# Patient Record
Sex: Female | Born: 1995 | Race: White | Hispanic: No | Marital: Single | Smoking: Never smoker
Health system: Southern US, Community
[De-identification: ages and names within clinical notes are randomized; demographics above are authoritative.]

## PROBLEM LIST (undated history)

## (undated) DIAGNOSIS — E282 Polycystic ovarian syndrome: Secondary | ICD-10-CM

## (undated) HISTORY — PX: TONSILLECTOMY: SUR1361

## (undated) HISTORY — DX: Polycystic ovarian syndrome: E28.2

---
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
## 2017-01-30 ENCOUNTER — Encounter (HOSPITAL_COMMUNITY): Payer: Self-pay | Admitting: Obstetrics and Gynecology

## 2017-01-30 ENCOUNTER — Emergency Department (HOSPITAL_COMMUNITY): Payer: BC Managed Care – PPO

## 2017-01-30 ENCOUNTER — Other Ambulatory Visit: Payer: Self-pay

## 2017-01-30 ENCOUNTER — Emergency Department (HOSPITAL_COMMUNITY)
Admission: EM | Admit: 2017-01-30 | Discharge: 2017-01-30 | Disposition: A | Discharge status: Home / Self Care | Payer: BC Managed Care – PPO | Attending: Emergency Medicine | Admitting: Emergency Medicine

## 2017-01-30 DIAGNOSIS — N390 Urinary tract infection, site not specified: Secondary | ICD-10-CM | POA: Diagnosis not present

## 2017-01-30 DIAGNOSIS — R102 Pelvic and perineal pain: Secondary | ICD-10-CM | POA: Diagnosis present

## 2017-01-30 DIAGNOSIS — N83201 Unspecified ovarian cyst, right side: Secondary | ICD-10-CM | POA: Insufficient documentation

## 2017-01-30 DIAGNOSIS — R109 Unspecified abdominal pain: Secondary | ICD-10-CM

## 2017-01-30 DIAGNOSIS — G90521 Complex regional pain syndrome I of right lower limb: Secondary | ICD-10-CM

## 2017-01-30 LAB — URINALYSIS, ROUTINE W REFLEX MICROSCOPIC
Bilirubin Urine: NEGATIVE
Glucose, UA: >=500 mg/dL — AB
Hgb urine dipstick: NEGATIVE
Ketones, ur: NEGATIVE mg/dL
Nitrite: NEGATIVE
Protein, ur: NEGATIVE mg/dL
Specific Gravity, Urine: 1.018 (ref 1.005–1.030)
pH: 5 (ref 5.0–8.0)

## 2017-01-30 LAB — COMPREHENSIVE METABOLIC PANEL
ALT: 47 U/L (ref 14–54)
AST: 61 U/L — AB (ref 15–41)
Albumin: 3.8 g/dL (ref 3.5–5.0)
Alkaline Phosphatase: 46 U/L (ref 38–126)
Anion gap: 10 (ref 5–15)
BUN: 8 mg/dL (ref 6–20)
CHLORIDE: 105 mmol/L (ref 101–111)
CO2: 22 mmol/L (ref 22–32)
Calcium: 9.1 mg/dL (ref 8.9–10.3)
Creatinine, Ser: 0.59 mg/dL (ref 0.44–1.00)
Glucose, Bld: 112 mg/dL — ABNORMAL HIGH (ref 65–99)
Potassium: 3.6 mmol/L (ref 3.5–5.1)
Sodium: 137 mmol/L (ref 135–145)
Total Bilirubin: 0.8 mg/dL (ref 0.3–1.2)
Total Protein: 7.8 g/dL (ref 6.5–8.1)

## 2017-01-30 LAB — CBC
HCT: 34.5 % — ABNORMAL LOW (ref 36.0–46.0)
Hemoglobin: 10.5 g/dL — ABNORMAL LOW (ref 12.0–15.0)
MCH: 22.7 pg — ABNORMAL LOW (ref 26.0–34.0)
MCHC: 30.4 g/dL (ref 30.0–36.0)
MCV: 74.7 fL — AB (ref 78.0–100.0)
PLATELETS: 443 10*3/uL — AB (ref 150–400)
RBC: 4.62 MIL/uL (ref 3.87–5.11)
RDW: 16.2 % — AB (ref 11.5–15.5)
WBC: 10.6 10*3/uL — AB (ref 4.0–10.5)

## 2017-01-30 LAB — WET PREP, GENITAL
Clue Cells Wet Prep HPF POC: NONE SEEN
Sperm: NONE SEEN
TRICH WET PREP: NONE SEEN
Yeast Wet Prep HPF POC: NONE SEEN

## 2017-01-30 LAB — POC URINE PREG, ED: Preg Test, Ur: NEGATIVE

## 2017-01-30 MED ORDER — DEXTROSE 5 % IV SOLN
1.0000 g | Freq: Once | INTRAVENOUS | Status: AC
  Administered 2017-01-30: 1 g via INTRAVENOUS
  Filled 2017-01-30: qty 10

## 2017-01-30 MED ORDER — CEPHALEXIN 500 MG PO CAPS: 500.0000 mg | ORAL_CAPSULE | Freq: Four times a day (QID) | ORAL | 0 refills | Status: DC

## 2017-01-30 MED ORDER — IOPAMIDOL (ISOVUE-300) INJECTION 61%
INTRAVENOUS | Status: AC
  Administered 2017-01-30: 100 mL via INTRAVENOUS
  Filled 2017-01-30: qty 100

## 2017-01-30 MED ORDER — HYDROMORPHONE HCL 1 MG/ML IJ SOLN
1.0000 mg | Freq: Once | INTRAMUSCULAR | Status: AC
  Administered 2017-01-30: 1 mg via INTRAMUSCULAR
  Filled 2017-01-30: qty 1

## 2017-01-30 MED ORDER — SODIUM CHLORIDE 0.9 % IJ SOLN
INTRAMUSCULAR | Status: AC
  Filled 2017-01-30: qty 50

## 2017-01-30 NOTE — ED Provider Notes (Signed)
[facility]-EMERGENCY DEPT Provider Note   CSN: [id number] Arrival date & time: 01/30/17  1213     History   Chief Complaint Chief Complaint  Patient presents with  . Pelvic Pain  . Abdominal Pain    HPI Nicole Lopez is a 21 y.o. female.  Patient c/o right pelvic/flank pain onset at 2 am today at rest. Pain constant, dull, moderate, non radiating. Similar to remote prior pain from ovarian cyst. Denies dysuria or hematuria. No vaginal discharge or bleeding. Not sexually active. lnmp 2-3 weeks ago. No hx kidney stone. No back pain. Denies vomiting. No diarrhea. No fever or chills.    The history is provided by the patient.  Pelvic Pain  Associated symptoms include abdominal pain. Pertinent negatives include no chest pain, no headaches and no shortness of breath.  Abdominal Pain   Pertinent negatives include fever and headaches.    No past medical history on file.  There are no active problems to display for this patient.     OB History    No data available       Home Medications    Prior to Admission medications   Medication Sig Start Date End Date Taking? Authorizing Provider  ibuprofen (ADVIL,MOTRIN) 200 MG tablet Take 400 mg by mouth every 6 (six) hours as needed for cramping.   Yes [provider]    Family History No family history on file.  Social History Social History   Tobacco Use  . Smoking status: Never Smoker  Substance Use Topics  . Alcohol use: No    Frequency: Never  . Drug use: No     Allergies   Penicillins   Review of Systems Review of Systems  Constitutional: Negative for fever.  HENT: Negative for sore throat.   Eyes: Negative for redness.  Respiratory: Negative for shortness of breath.   Cardiovascular: Negative for chest pain.  Gastrointestinal: Positive for abdominal pain.  Genitourinary: Positive for pelvic pain. Negative for flank pain, vaginal bleeding and vaginal discharge.    Musculoskeletal: Negative for back pain and neck pain.  Skin: Negative for rash.  Neurological: Negative for headaches.  Hematological: Does not bruise/bleed easily.  Psychiatric/Behavioral: Negative for confusion.     Physical Exam Updated Vital Signs BP (!) 141/96 (BP Location: Left Arm)   Pulse 84   Temp 98.2 F (36.8 C) (Oral)   Resp 18   Ht 1.803 m (5\' 11" )   LMP 01/16/2017   SpO2 98%   Physical Exam  Constitutional: She appears well-developed and well-nourished. No distress.  HENT:  Mouth/Throat: Oropharynx is clear and moist.  Eyes: Conjunctivae are normal. No scleral icterus.  Neck: Neck supple. No tracheal deviation present.  Cardiovascular: Normal rate, regular rhythm, normal heart sounds and intact distal pulses. Exam reveals no gallop and no friction rub.  No murmur heard. Pulmonary/Chest: Effort normal and breath sounds normal. No respiratory distress.  Abdominal: Soft. Normal appearance and bowel sounds are normal. She exhibits no distension and no mass. There is no tenderness. There is no rebound and no guarding. No hernia.  Genitourinary:  Genitourinary Comments: No cva tenderness. Pt w no prior sexual activity or tampon use - pt unable to tolerate internal exam, mild whitish discharge noted.   Musculoskeletal: She exhibits no edema.  Neurological: She is alert.  Skin: Skin is warm and dry. No rash noted. She is not diaphoretic.  Psychiatric: She has a normal mood and affect.  Nursing note and vitals reviewed.  ED Treatments / Results  Labs (all labs ordered are listed, but only abnormal results are displayed) Results for orders placed or performed during the hospital encounter of 01/30/17  Wet prep, genital  Result Value Ref Range   Yeast Wet Prep HPF POC NONE SEEN NONE SEEN   Trich, Wet Prep NONE SEEN NONE SEEN   Clue Cells Wet Prep HPF POC NONE SEEN NONE SEEN   WBC, Wet Prep HPF POC MANY (A) NONE SEEN   Sperm NONE SEEN   Urinalysis, Routine w  reflex microscopic  Result Value Ref Range   Color, Urine YELLOW YELLOW   APPearance HAZY (A) CLEAR   Specific Gravity, Urine 1.018 1.005 - 1.030   pH 5.0 5.0 - 8.0   Glucose, UA >=500 (A) NEGATIVE mg/dL   Hgb urine dipstick NEGATIVE NEGATIVE   Bilirubin Urine NEGATIVE NEGATIVE   Ketones, ur NEGATIVE NEGATIVE mg/dL   Protein, ur NEGATIVE NEGATIVE mg/dL   Nitrite NEGATIVE NEGATIVE   Leukocytes, UA LARGE (A) NEGATIVE   RBC / HPF 6-30 0 - 5 RBC/hpf   WBC, UA TOO NUMEROUS TO COUNT 0 - 5 WBC/hpf   Bacteria, UA FEW (A) NONE SEEN   Squamous Epithelial / LPF 6-30 (A) NONE SEEN   Mucus PRESENT   CBC  Result Value Ref Range   WBC 10.6 (H) 4.0 - 10.5 K/uL   RBC 4.62 3.87 - 5.11 MIL/uL   Hemoglobin 10.5 (L) 12.0 - 15.0 g/dL   HCT 96.0 (L) 45.4 - 09.8 %   MCV 74.7 (L) 78.0 - 100.0 fL   MCH 22.7 (L) 26.0 - 34.0 pg   MCHC 30.4 30.0 - 36.0 g/dL   RDW 11.9 (H) 14.7 - 82.9 %   Platelets 443 (H) 150 - 400 K/uL  Comprehensive metabolic panel  Result Value Ref Range   Sodium 137 135 - 145 mmol/L   Potassium 3.6 3.5 - 5.1 mmol/L   Chloride 105 101 - 111 mmol/L   CO2 22 22 - 32 mmol/L   Glucose, Bld 112 (H) 65 - 99 mg/dL   BUN 8 6 - 20 mg/dL   Creatinine, Ser 5.62 0.44 - 1.00 mg/dL   Calcium 9.1 8.9 - 13.0 mg/dL   Total Protein 7.8 6.5 - 8.1 g/dL   Albumin 3.8 3.5 - 5.0 g/dL   AST 61 (H) 15 - 41 U/L   ALT 47 14 - 54 U/L   Alkaline Phosphatase 46 38 - 126 U/L   Total Bilirubin 0.8 0.3 - 1.2 mg/dL   GFR calc non Af Amer >60 >60 mL/min   GFR calc Af Amer >60 >60 mL/min   Anion gap 10 5 - 15  POC urine preg, ED  Result Value Ref Range   Preg Test, Ur NEGATIVE NEGATIVE   US Transvaginal Non-ob  Result Date: 01/30/2017 CLINICAL DATA:  Right-sided pelvic/ flank pain. History of prior left salpingo-oophorectomy for ovarian torsion. EXAM: TRANSABDOMINAL AND TRANSVAGINAL ULTRASOUND OF PELVIS DOPPLER ULTRASOUND OF OVARIES TECHNIQUE: Both transabdominal and transvaginal ultrasound examinations of  the pelvis were performed. Transabdominal technique was performed for global imaging of the pelvis including uterus, ovaries, adnexal regions, and pelvic cul-de-sac. It was necessary to proceed with endovaginal exam following the transabdominal exam to visualize the ovaries and endometrium. Color and duplex Doppler ultrasound was utilized to evaluate blood flow to the ovaries. COMPARISON:  None. FINDINGS: Uterus Measurements: 8.2 x 2.5 x 4.6 cm. No fibroids or other mass visualized. Endometrium Thickness: 5.7 mm.  No focal  abnormality visualized. Right ovary Measurements: 3.9 x 2.6 x 3.8 cm. 2.4 x 2.9 x 2.4 cm cyst. Unable to document arterial blood flow. Color flow Doppler was demonstrated and venous blood flow was demonstrated. Difficult exam due to body habitus and being unable to perform transvaginal examination due to pain. Left ovary Surgically absent. Pulsed Doppler evaluation of both ovaries demonstrates unable to document arterial blood flow to the right ovary. Other findings No abnormal free fluid. IMPRESSION: 1. Status post left salpingo-oophorectomy. 2. Slightly enlarged right ovary. Unable to document arterial blood flow. This could be technical as transvaginal examination could not be performed. 3. Normal appearance of the uterus. Electronically Signed   By: Rudie MeyerP.  Gallerani M.D.   On: 01/30/2017 19:27   [place] Pelvis Complete  Result Date: 01/30/2017 CLINICAL DATA:  Right-sided pelvic/ flank pain. History of prior left salpingo-oophorectomy for ovarian torsion. EXAM: TRANSABDOMINAL AND TRANSVAGINAL ULTRASOUND OF PELVIS DOPPLER ULTRASOUND OF OVARIES TECHNIQUE: Both transabdominal and transvaginal ultrasound examinations of the pelvis were performed. Transabdominal technique was performed for global imaging of the pelvis including uterus, ovaries, adnexal regions, and pelvic cul-de-sac. It was necessary to proceed with endovaginal exam following the transabdominal exam to visualize the ovaries and  endometrium. Color and duplex Doppler ultrasound was utilized to evaluate blood flow to the ovaries. COMPARISON:  None. FINDINGS: Uterus Measurements: 8.2 x 2.5 x 4.6 cm. No fibroids or other mass visualized. Endometrium Thickness: 5.7 mm.  No focal abnormality visualized. Right ovary Measurements: 3.9 x 2.6 x 3.8 cm. 2.4 x 2.9 x 2.4 cm cyst. Unable to document arterial blood flow. Color flow Doppler was demonstrated and venous blood flow was demonstrated. Difficult exam due to body habitus and being unable to perform transvaginal examination due to pain. Left ovary Surgically absent. Pulsed Doppler evaluation of both ovaries demonstrates unable to document arterial blood flow to the right ovary. Other findings No abnormal free fluid. IMPRESSION: 1. Status post left salpingo-oophorectomy. 2. Slightly enlarged right ovary. Unable to document arterial blood flow. This could be technical as transvaginal examination could not be performed. 3. Normal appearance of the uterus. Electronically Signed   By: Rudie MeyerP.  Gallerani M.D.   On: 01/30/2017 19:27   Ct Abdomen Pelvis W Contrast  Result Date: 01/30/2017 CLINICAL DATA:  Pelvic pain. Prior history of left salpingo-oophorectomy for ovarian torsion. EXAM: CT ABDOMEN AND PELVIS WITH CONTRAST TECHNIQUE: Multidetector CT imaging of the abdomen and pelvis was performed using the standard protocol following bolus administration of intravenous contrast. CONTRAST:  100 cc ISOVUE-300 IOPAMIDOL (ISOVUE-300) INJECTION 61% COMPARISON:  Pelvic ultrasound 01/30/2017 FINDINGS: Lower chest: The lung bases are clear of acute process. No pleural effusion or pulmonary lesions. The heart is normal in size. No pericardial effusion. The distal esophagus and aorta are unremarkable. Hepatobiliary: Mild diffuse fatty infiltration of the liver but no focal hepatic lesions or intrahepatic biliary dilatation. The gallbladder is normal. No common bile duct dilatation. Pancreas: No mass, inflammation  or ductal dilatation. Spleen: Normal size.  No focal lesions. Adrenals/Urinary Tract: The adrenal glands and kidneys are normal. No bladder abnormality. Stomach/Bowel: The stomach, duodenum, small bowel and colon are grossly normal without oral contrast. No acute inflammatory changes, mass lesions or obstructive findings. High transverse cecum with the ileocecal bowel just the left of the midline of the upper pelvis. The terminal ileum is normal. The appendix is normal. Vascular/Lymphatic: The aorta is normal in caliber. No dissection. The branch vessels are patent. The major venous structures are patent. No mesenteric or retroperitoneal  mass or adenopathy. Small scattered lymph nodes are noted. Reproductive: The uterus is normal. The left ovary is surgically absent. The right ovary is slightly enlarged measuring 5.2 x 5.1 x 3.3 cm. I do not see any inflammation around the ovary to suggest ovarian torsion. Multiple follicles are noted. There is a small adjacent paraovarian cyst. Other: No free pelvic fluid collections. Scattered pelvic and axillary lymph nodes. Musculoskeletal: No significant bony findings. IMPRESSION: 1. The right ovary is slightly enlarged but no obvious inflammatory process or secondary findings to suggest ovarian torsion. Small adjacent simple appearing cysts is noted. 2. The left ovary is surgically absent. 3. High transverse cecum with the ileocecal valve in the left upper pelvis. The appendix is normal. 4. Mild diffuse fatty infiltration of liver. Electronically Signed   By: Rudie Meyer M.D.   On: 01/30/2017 19:54   [place] Art/ven Flow Abd Pelv Doppler  Result Date: 01/30/2017 CLINICAL DATA:  Right-sided pelvic/ flank pain. History of prior left salpingo-oophorectomy for ovarian torsion. EXAM: TRANSABDOMINAL AND TRANSVAGINAL ULTRASOUND OF PELVIS DOPPLER ULTRASOUND OF OVARIES TECHNIQUE: Both transabdominal and transvaginal ultrasound examinations of the pelvis were performed. Transabdominal  technique was performed for global imaging of the pelvis including uterus, ovaries, adnexal regions, and pelvic cul-de-sac. It was necessary to proceed with endovaginal exam following the transabdominal exam to visualize the ovaries and endometrium. Color and duplex Doppler ultrasound was utilized to evaluate blood flow to the ovaries. COMPARISON:  None. FINDINGS: Uterus Measurements: 8.2 x 2.5 x 4.6 cm. No fibroids or other mass visualized. Endometrium Thickness: 5.7 mm.  No focal abnormality visualized. Right ovary Measurements: 3.9 x 2.6 x 3.8 cm. 2.4 x 2.9 x 2.4 cm cyst. Unable to document arterial blood flow. Color flow Doppler was demonstrated and venous blood flow was demonstrated. Difficult exam due to body habitus and being unable to perform transvaginal examination due to pain. Left ovary Surgically absent. Pulsed Doppler evaluation of both ovaries demonstrates unable to document arterial blood flow to the right ovary. Other findings No abnormal free fluid. IMPRESSION: 1. Status post left salpingo-oophorectomy. 2. Slightly enlarged right ovary. Unable to document arterial blood flow. This could be technical as transvaginal examination could not be performed. 3. Normal appearance of the uterus. Electronically Signed   By: Rudie Meyer M.D.   On: 01/30/2017 19:27    EKG  EKG Interpretation None       Radiology No results found.  Procedures Procedures (including critical care time)  Medications Ordered in ED Medications - No data to display   Initial Impression / Assessment and Plan / ED Course  I have reviewed the triage vital signs and the nursing notes.  Pertinent labs & imaging results that were available during my care of the patient were reviewed by me and considered in my medical decision making (see chart for details).  Labs. Ultrasound.  Reviewed nursing notes and prior charts for additional history.   U/s with 3-4 cm ovarian cyst - tech indicates unable to do transvag  exam, and due to body habitus and inability to get transvag exam, despite multiple attempts, unable to adequately visualize for definitive demonstration of art flow.  Case discussed with ob/gyn on call, Dr Vergie Living - he reviewed u/s and indicates as u/s with venous flow, he feels pt has cyst, but no torsion, and that pt can f/u gyn as outpt on prn basis.   Patient does have uti on labs, rocephin iv in ED. rx for home.  Pain is controlled. No  current pain. abd soft nt.   Patient currently appears stable for d/c.     Final Clinical Impressions(s) / ED Diagnoses   Final diagnoses:  None    ED Discharge Orders    None       Cathren LaineSteinl, Emiley Digiacomo, MD 01/30/17 2038

## 2017-01-30 NOTE — ED Notes (Signed)
Writer went to obtain blood work from patient but patient was leaving room for CT.

## 2017-01-30 NOTE — Discharge Instructions (Signed)
It was our pleasure to provide your ER care today - we hope that you feel better.  Rest. Drink plenty of fluids.  The lab tests show a urine infection - take antibiotic (keflex) as prescribed.    The ultrasound shows a 4 cm right ovarian cyst - follow up with your ob/gyn doctor in the next couple weeks - call office Monday to arrange appointment.   Take acetaminophen and/or ibuprofen as need for pain.  Return to ER if worse, new symptoms, severe or intractable pain, high fevers, persistent vomiting, other concern.   You were given pain medication in the ER - no driving for the next 6 hours.

## 2017-01-30 NOTE — ED Triage Notes (Signed)
Pt reports having pain in her pelvis, stomach and around to her back and down her leg. Pt reports she has no hx of fibroids, but a few years ago she had a cyst in her ovary wrap around her fallopian tube and she had to have surgery to fix it.  Pt reports there is no chance she could be pregnant, pt reports no vaginal d/c at this time.

## 2017-01-30 NOTE — ED Notes (Signed)
MD at bedside. Will obtain vitals once MD leaves.

## 2017-02-01 LAB — GC/CHLAMYDIA PROBE AMP (~~LOC~~) NOT AT ARMC
Chlamydia: NEGATIVE
Neisseria Gonorrhea: NEGATIVE

## 2017-02-26 ENCOUNTER — Encounter: Payer: Self-pay | Admitting: Endocrinology

## 2017-03-31 ENCOUNTER — Ambulatory Visit: Payer: BC Managed Care – PPO | Admitting: Endocrinology

## 2017-03-31 ENCOUNTER — Encounter: Payer: Self-pay | Admitting: Endocrinology

## 2017-03-31 ENCOUNTER — Telehealth: Payer: Self-pay | Admitting: Endocrinology

## 2017-03-31 DIAGNOSIS — E119 Type 2 diabetes mellitus without complications: Secondary | ICD-10-CM

## 2017-03-31 MED ORDER — GLUCOSE BLOOD VI STRP: 1.0000 | ORAL_STRIP | Freq: Every day | 12 refills | Status: DC

## 2017-03-31 NOTE — Patient Instructions (Addendum)
good diet and exercise significantly improve the control of your diabetes.  please let me know if you wish to be referred to a dietician.  high blood sugar is very risky to your health.  you should see an eye doctor and dentist every year.  It is very important to get all recommended vaccinations.  Controlling your blood pressure and cholesterol drastically reduces the damage diabetes does to your body.  Those who smoke should quit.  Please discuss these with your doctor.  check your blood sugar twice a day.  vary the time of day when you check, between before the 3 meals, and at bedtime.  also check if you have symptoms of your blood sugar being too high or too low.  please keep a record of the readings and bring it to your next appointment here (or you can bring the meter itself).  You can write it on any piece of paper.  please call us sooner if your blood sugar goes below 70, or if you have a lot of readings over 200.   Here is a new meter.  I have sent a prescription to your pharmacy, for strips.   Please start taking the metformin that Dr Ernestina Penna prescribed for you.  Please come back for a follow-up appointment in 2 months.  At our office, we are fortunate to have two specialists who are happy to help you:  Cristy Folks, RN, CDE, is a diabetes educator and pump trainer.  She is here on Monday mornings, and all day Tuesday and Wednesday.  She is can help you with low blood sugar avoidance and treatment, injecting insulin, sick day management, and others.   Oran Rein, RD is our dietician.  She is here all day Thursday and Friday.  She can advise you about a healthy diet.  She can also help you about a variety of special diabetes situations, such as shift work, Animal nutritionist, gluten-free, diet for kidney patients, traveling with diabetes, and help for those who need to gain weight.      Bariatric Surgery You have so much to gain by losing weight.  You may have already tried every diet and  exercise plan imaginable.  And, you may have sought advice from your family physician, too.   Sometimes, in spite of such diligent efforts, you may not be able to achieve long-term results by yourself.  In cases of severe obesity, bariatric or weight loss surgery is a proven method of achieving long-term weight control.  Our Services Our bariatric surgery programs offer our patients new hope and long-term weight-loss solution.  Since introducing our services in 2003, we have conducted more than 2,400 successful procedures.  Our program is designated as a Investment banker, corporate by the Metabolic and Bariatric Surgery Accreditation and Quality Improvement Program (MBSAQIP), a Child psychotherapist that sets rigorous patient safety and outcome standards.  Our program is also designated as a Engineer, manufacturing systems by [employer].   Our exceptional weight-loss surgery team specializes in diagnosis, treatment, follow-up care, and ongoing support for our patients with severe weight loss challenges.  We currently offer laparoscopic sleeve gastrectomy, gastric bypass, and adjustable gastric band (LAP-BAND).    Attend our Bariatrics Seminar Choosing to undergo a bariatric procedure is a big decision, and one that should not be taken lightly.  You now have two options in how you learn about weight-loss surgery - in person or online.  Our objective is to ensure you have all of the  information that you need to evaluate the advantages and obligations of this life changing procedure.  Please note that you are not alone in this process, and our experienced team is ready to assist and answer all of your questions.  There are several ways to register for a seminar (either on-line or in person): 1)  Call [phone] 2) Go on-line to [facility] and register for either type of seminar.  [email or web address]

## 2017-03-31 NOTE — Progress Notes (Signed)
Subjective:    Patient ID: Nicole Lopez, female    DOB: Apr 19, 1995, 22 y.o.   MRN: [id number]  HPI Pt is referred by Dr Algie Coffer, for diabetes.  Pt states DM was dx'ed 1 month ago; she has mild if any neuropathy of the lower extremities; she is unaware of any associated chronic complications; she has never been on insulin; she was rx'ed metformin, but she has not yet started; pt says her diet and exercise are improved; she has never had GDM, pancreatitis, pancreatic surgery, severe hypoglycemia or DKA.   Past Medical History:  Diagnosis Date  . PCOS (polycystic ovarian syndrome)     Past Surgical History:  Procedure Laterality Date  . SALPINGOOPHORECTOMY Left 2012  . TONSILLECTOMY      Social History   Socioeconomic History  . Marital status: Single    Spouse name: Not on file  . Number of children: Not on file  . Years of education: Not on file  . Highest education level: Not on file  Social Needs  . Financial resource strain: Not on file  . Food insecurity - worry: Not on file  . Food insecurity - inability: Not on file  . Transportation needs - medical: Not on file  . Transportation needs - non-medical: Not on file  Occupational History  . Not on file  Tobacco Use  . Smoking status: Never Smoker  . Smokeless tobacco: Never Used  Substance and Sexual Activity  . Alcohol use: No    Frequency: Never  . Drug use: No  . Sexual activity: No  Other Topics Concern  . Not on file  Social History Narrative  . Not on file    Current Outpatient Medications on File Prior to Visit  Medication Sig Dispense Refill  . cephALEXin (KEFLEX) 500 MG capsule Take 1 capsule (500 mg total) by mouth 4 (four) times daily. 20 capsule 0  . ibuprofen (ADVIL,MOTRIN) 200 MG tablet Take 400 mg by mouth every 6 (six) hours as needed for cramping.     No current facility-administered medications on file prior to visit.     Allergies  Allergen Reactions  . Penicillins Rash    Has patient  had a PCN reaction causing immediate rash, facial/tongue/throat swelling, SOB or lightheadedness with hypotension: No Has patient had a PCN reaction causing severe rash involving mucus membranes or skin necrosis: No Has patient had a PCN reaction that required hospitalization: No Has patient had a PCN reaction occurring within the last 10 years: No If all of the above answers are "NO", then may proceed with Cephalosporin use.     Family History  Problem Relation Age of Onset  . Gestational diabetes Mother   . Hypertension Father   . Heart murmur Sister     BP 138/68 (BP Location: Left Arm, Patient Position: Sitting, Cuff Size: Large)   Pulse (!) 101   Ht 5\' 11"  (1.803 m)   Wt 289 lb 12.8 oz (131.5 kg)   SpO2 98%   BMI 40.42 kg/m     Review of Systems denies weight loss, blurry vision, headache, chest pain, sob, n/v, urinary frequency, muscle cramps, excessive diaphoresis, depression, cold intolerance, rhinorrhea, and easy bruising.     Objective:   Physical Exam VS: see vs page GEN: no distress HEAD: head: no deformity eyes: no periorbital swelling, no proptosis external nose and ears are normal mouth: no lesion seen NECK: supple, thyroid is not enlarged CHEST WALL: no deformity LUNGS: clear to auscultation  CV: reg rate and rhythm, no murmur ABD: abdomen is soft, nontender.  no hepatosplenomegaly.  not distended.  no hernia MUSCULOSKELETAL: muscle bulk and strength are grossly normal.  no obvious joint swelling.  gait is normal and steady EXTEMITIES: no deformity.  no ulcer on the feet.  feet are of normal color and temp.  no edema PULSES: dorsalis pedis intact bilat.  no carotid bruit NEURO:  cn 2-12 grossly intact.   readily moves all 4's.  sensation is intact to touch on the feet SKIN:  Normal texture and temperature.  No rash or suspicious lesion is visible.   NODES:  None palpable at the neck PSYCH: alert, well-oriented.  Does not appear anxious nor depressed.     I have reviewed outside records, and summarized: Pt was noted to have elevated a1c, and referred here. Several other probs were addressed, including ovarian cyst and UTI  outside test results are reviewed: A1c=9.0%    Assessment & Plan:  Type 2 DM: new.  Obesity: we discussed benefit of weight loss surgery.    Patient Instructions  good diet and exercise significantly improve the control of your diabetes.  please let me know if you wish to be referred to a dietician.  high blood sugar is very risky to your health.  you should see an eye doctor and dentist every year.  It is very important to get all recommended vaccinations.  Controlling your blood pressure and cholesterol drastically reduces the damage diabetes does to your body.  Those who smoke should quit.  Please discuss these with your doctor.  check your blood sugar twice a day.  vary the time of day when you check, between before the 3 meals, and at bedtime.  also check if you have symptoms of your blood sugar being too high or too low.  please keep a record of the readings and bring it to your next appointment here (or you can bring the meter itself).  You can write it on any piece of paper.  please call us sooner if your blood sugar goes below 70, or if you have a lot of readings over 200.   Here is a new meter.  I have sent a prescription to your pharmacy, for strips.   Please start taking the metformin that Dr Ernestina PennaFogleman prescribed for you.  Please come back for a follow-up appointment in 2 months.  At our office, we are fortunate to have two specialists who are happy to help you:  Cristy FolksLinda Spagnola, RN, CDE, is a diabetes educator and pump trainer.  She is here on Monday mornings, and all day Tuesday and Wednesday.  She is can help you with low blood sugar avoidance and treatment, injecting insulin, sick day management, and others.   Oran ReinLaura Jobe, RD is our dietician.  She is here all day Thursday and Friday.  She can advise you about a  healthy diet.  She can also help you about a variety of special diabetes situations, such as shift work, Animal nutritionistvegeterian diet, gluten-free, diet for kidney patients, traveling with diabetes, and help for those who need to gain weight.      Bariatric Surgery You have so much to gain by losing weight.  You may have already tried every diet and exercise plan imaginable.  And, you may have sought advice from your family physician, too.   Sometimes, in spite of such diligent efforts, you may not be able to achieve long-term results by yourself.  In cases of  severe obesity, bariatric or weight loss surgery is a proven method of achieving long-term weight control.  Our Services Our bariatric surgery programs offer our patients new hope and long-term weight-loss solution.  Since introducing our services in 2003, we have conducted more than 2,400 successful procedures.  Our program is designated as a Investment banker, corporate by the Metabolic and Bariatric Surgery Accreditation and Quality Improvement Program (MBSAQIP), a Child psychotherapist that sets rigorous patient safety and outcome standards.  Our program is also designated as a Engineer, manufacturing systems by [employer].   Our exceptional weight-loss surgery team specializes in diagnosis, treatment, follow-up care, and ongoing support for our patients with severe weight loss challenges.  We currently offer laparoscopic sleeve gastrectomy, gastric bypass, and adjustable gastric band (LAP-BAND).    Attend our Bariatrics Seminar Choosing to undergo a bariatric procedure is a big decision, and one that should not be taken lightly.  You now have two options in how you learn about weight-loss surgery - in person or online.  Our objective is to ensure you have all of the information that you need to evaluate the advantages and obligations of this life changing procedure.  Please note that you are not alone in this process, and our experienced team is ready to  assist and answer all of your questions.  There are several ways to register for a seminar (either on-line or in person): 1)  Call [phone] 2) Go on-line to [facility] and register for either type of seminar.  [email or web address]

## 2017-03-31 NOTE — Telephone Encounter (Signed)
Patient was in the office today and explained to the dr that her OB-GYN had prescribed her metformin.  She does not remember the dosage or anything on this medication. She told dr she did not need this sent in because of OB-GYN but she no longer has a prescription and would like Dr Everardo Allellison to send in for her.    Please advise   [id number] - [place], [place] - [address]

## 2017-04-02 MED ORDER — METFORMIN HCL ER 500 MG PO TB24
1000.0000 mg | ORAL_TABLET | Freq: Every day | ORAL | 3 refills | Status: DC
Start: 2017-04-02 — End: 2018-05-04

## 2017-04-02 NOTE — Telephone Encounter (Signed)
Left detailed mess informing pt of below.  

## 2017-04-02 NOTE — Telephone Encounter (Signed)
Ok, I have sent a prescription to your pharmacy 

## 2017-04-03 DIAGNOSIS — E119 Type 2 diabetes mellitus without complications: Secondary | ICD-10-CM | POA: Insufficient documentation

## 2017-04-07 ENCOUNTER — Other Ambulatory Visit: Payer: Self-pay

## 2017-04-15 ENCOUNTER — Other Ambulatory Visit: Payer: Self-pay

## 2017-04-15 MED ORDER — ONETOUCH ULTRASOFT LANCETS MISC: 12 refills | Status: DC

## 2017-04-15 MED ORDER — GLUCOSE BLOOD VI STRP: ORAL_STRIP | 12 refills | Status: DC

## 2017-04-15 MED ORDER — ONETOUCH ULTRA 2 W/DEVICE KIT: 1.0000 | PACK | Freq: Every day | 0 refills | Status: DC

## 2017-04-30 ENCOUNTER — Other Ambulatory Visit: Payer: Self-pay

## 2017-04-30 MED ORDER — ACCU-CHEK FASTCLIX LANCETS MISC: 4 refills | Status: AC

## 2017-04-30 MED ORDER — ACCU-CHEK GUIDE W/DEVICE KIT: 1.0000 | PACK | 1 refills | Status: AC

## 2017-04-30 MED ORDER — GLUCOSE BLOOD VI STRP: ORAL_STRIP | 2 refills | Status: AC

## 2017-06-01 ENCOUNTER — Ambulatory Visit: Payer: BC Managed Care – PPO | Admitting: Endocrinology

## 2017-06-01 DIAGNOSIS — Z0289 Encounter for other administrative examinations: Secondary | ICD-10-CM

## 2018-05-01 ENCOUNTER — Other Ambulatory Visit: Payer: Self-pay | Admitting: Endocrinology

## 2018-06-21 ENCOUNTER — Emergency Department (HOSPITAL_COMMUNITY)
Admission: EM | Admit: 2018-06-21 | Discharge: 2018-06-22 | Disposition: A | Discharge status: Home / Self Care | Payer: BC Managed Care – PPO | Attending: Emergency Medicine | Admitting: Emergency Medicine

## 2018-06-21 ENCOUNTER — Other Ambulatory Visit: Payer: Self-pay

## 2018-06-21 DIAGNOSIS — R0981 Nasal congestion: Secondary | ICD-10-CM | POA: Insufficient documentation

## 2018-06-21 DIAGNOSIS — R0602 Shortness of breath: Secondary | ICD-10-CM | POA: Insufficient documentation

## 2018-06-21 DIAGNOSIS — R05 Cough: Secondary | ICD-10-CM | POA: Insufficient documentation

## 2018-06-21 DIAGNOSIS — M7918 Myalgia, other site: Secondary | ICD-10-CM | POA: Diagnosis not present

## 2018-06-21 DIAGNOSIS — Z20828 Contact with and (suspected) exposure to other viral communicable diseases: Secondary | ICD-10-CM | POA: Insufficient documentation

## 2018-06-21 DIAGNOSIS — Z7984 Long term (current) use of oral hypoglycemic drugs: Secondary | ICD-10-CM | POA: Diagnosis not present

## 2018-06-21 DIAGNOSIS — R748 Abnormal levels of other serum enzymes: Secondary | ICD-10-CM | POA: Diagnosis not present

## 2018-06-21 DIAGNOSIS — R059 Cough, unspecified: Secondary | ICD-10-CM

## 2018-06-21 DIAGNOSIS — R509 Fever, unspecified: Secondary | ICD-10-CM | POA: Insufficient documentation

## 2018-06-21 DIAGNOSIS — R000 Tachycardia, unspecified: Secondary | ICD-10-CM | POA: Diagnosis not present

## 2018-06-21 DIAGNOSIS — E119 Type 2 diabetes mellitus without complications: Secondary | ICD-10-CM | POA: Diagnosis not present

## 2018-06-21 MED ORDER — SODIUM CHLORIDE 0.9% FLUSH: 3.0000 mL | Freq: Once | INTRAVENOUS | Status: DC

## 2018-06-21 NOTE — ED Triage Notes (Signed)
Pt c/o fever, fatigue, generalized body aches, chills, and chest tightness, etc.

## 2018-06-22 ENCOUNTER — Emergency Department (HOSPITAL_COMMUNITY): Payer: BC Managed Care – PPO

## 2018-06-22 LAB — COMPREHENSIVE METABOLIC PANEL
ALT: 101 U/L — ABNORMAL HIGH (ref 0–44)
AST: 219 U/L — ABNORMAL HIGH (ref 15–41)
Albumin: 3.9 g/dL (ref 3.5–5.0)
Alkaline Phosphatase: 48 U/L (ref 38–126)
Anion gap: 11 (ref 5–15)
BUN: 7 mg/dL (ref 6–20)
CO2: 19 mmol/L — ABNORMAL LOW (ref 22–32)
Calcium: 8.9 mg/dL (ref 8.9–10.3)
Chloride: 100 mmol/L (ref 98–111)
Creatinine, Ser: 0.63 mg/dL (ref 0.44–1.00)
GFR calc Af Amer: >60 mL/min (ref >60)
GFR calc non Af Amer: >60 mL/min (ref >60)
Glucose, Bld: 337 mg/dL — ABNORMAL HIGH (ref 70–99)
Potassium: 3.9 mmol/L (ref 3.5–5.1)
Sodium: 130 mmol/L — ABNORMAL LOW (ref 135–145)
Total Bilirubin: 0.8 mg/dL (ref 0.3–1.2)
Total Protein: 7.8 g/dL (ref 6.5–8.1)

## 2018-06-22 LAB — CBC WITH DIFFERENTIAL/PLATELET
Abs Immature Granulocytes: 0.1 10*3/uL — ABNORMAL HIGH (ref 0.00–0.07)
Basophils Absolute: 0 10*3/uL (ref 0.0–0.1)
Basophils Relative: 0 %
Eosinophils Absolute: 0.1 10*3/uL (ref 0.0–0.5)
Eosinophils Relative: 2 %
HCT: 38.4 % (ref 36.0–46.0)
Hemoglobin: 11.6 g/dL — ABNORMAL LOW (ref 12.0–15.0)
Immature Granulocytes: 2 %
Lymphocytes Relative: 7 %
Lymphs Abs: 0.5 10*3/uL — ABNORMAL LOW (ref 0.7–4.0)
MCH: 22 pg — ABNORMAL LOW (ref 26.0–34.0)
MCHC: 30.2 g/dL (ref 30.0–36.0)
MCV: 72.7 fL — ABNORMAL LOW (ref 80.0–100.0)
Monocytes Absolute: 0.3 10*3/uL (ref 0.1–1.0)
Monocytes Relative: 5 %
Neutro Abs: 5.6 10*3/uL (ref 1.7–7.7)
Neutrophils Relative %: 84 %
Platelets: 291 10*3/uL (ref 150–400)
RBC: 5.28 MIL/uL — ABNORMAL HIGH (ref 3.87–5.11)
RDW: 16.5 % — ABNORMAL HIGH (ref 11.5–15.5)
WBC: 6.7 10*3/uL (ref 4.0–10.5)
nRBC: 0 % (ref 0.0–0.2)

## 2018-06-22 LAB — LACTIC ACID, PLASMA
Lactic Acid, Venous: 2.9 mmol/L (ref 0.5–1.9)
Lactic Acid, Venous: 1.5 mmol/L (ref 0.5–1.9)

## 2018-06-22 LAB — URINALYSIS, ROUTINE W REFLEX MICROSCOPIC
Bilirubin Urine: NEGATIVE
Glucose, UA: >=500 mg/dL — AB
Ketones, ur: 5 mg/dL — AB
Nitrite: NEGATIVE
Protein, ur: NEGATIVE mg/dL
Specific Gravity, Urine: 1.035 — ABNORMAL HIGH (ref 1.005–1.030)
pH: 5 (ref 5.0–8.0)

## 2018-06-22 LAB — I-STAT BETA HCG BLOOD, ED (MC, WL, AP ONLY): I-stat hCG, quantitative: <5 m[IU]/mL (ref <5)

## 2018-06-22 MED ORDER — SODIUM CHLORIDE 0.9 % IV BOLUS
1000.0000 mL | Freq: Once | INTRAVENOUS | Status: AC
  Administered 2018-06-22: 1000 mL via INTRAVENOUS

## 2018-06-22 MED ORDER — BENZONATATE 100 MG PO CAPS: 100.0000 mg | ORAL_CAPSULE | Freq: Three times a day (TID) | ORAL | 0 refills | Status: AC

## 2018-06-22 NOTE — Discharge Instructions (Signed)
Please read and follow all provided instructions.  Your diagnoses today include:  1. Febrile illness   2. Cough   3. Elevated liver enzymes     Tests performed today include:  Blood counts and electrolytes -normal white blood cell count however you have low lymphocytes which are a type of white blood cell  Electrolytes, liver function, kidney function -elevated liver enzymes  Chest x-ray -no sign of pneumonia  COVID-19 testing -is pending  Vital signs. See below for your results today.   Medications prescribed:   Tessalon Perles - cough suppressant medication  Please use over-the-counter Tylenol as directed on the packaging for pain.   Take any prescribed medications only as directed.  Home care instructions:  Follow any educational materials contained in this packet. Please continue drinking plenty of fluids. Use over-the-counter cold and flu medications as needed as directed on packaging for symptom relief. You may also use ibuprofen or tylenol as directed on packaging for pain or fever.   BE VERY CAREFUL not to take multiple medicines containing Tylenol (also called acetaminophen). Doing so can lead to an overdose which can damage your liver and cause liver failure and possibly death.   It is possible that she will have contracted coronavirus.  This test should be back in the next 48 to 72 hours.  If you have coronavirus, you should isolate yourself for at least 10 days and until you have at least 3 days of improvement in your symptoms.  You could potentially be contagious during this time.  Follow-up instructions: Please follow-up with your primary care provider in the next 7 days for further evaluation of your symptoms and recheck of your labs.  Return instructions:   Please return to the Emergency Department if you experience worsening symptoms.  Return if you develop worsening shortness of breath, respiratory distress or trouble breathing.  Please return if you have  a high fever greater than 101 degrees not controlled with over-the-counter medications, persistent vomiting and cannot keep down fluids, or worsening trouble breathing.  Please return if you have any other emergent concerns.  Additional Information:  Your vital signs today were: BP 121/82 (BP Location: Right Arm)    Pulse (!) 117    Temp 98.3 F (36.8 C) (Oral)    Resp 16    Ht 5\' 10"  (1.778 m)    Wt (!) 158.8 kg    LMP 06/07/2018 (Approximate)    SpO2 99%    BMI 50.22 kg/m  If your blood pressure (BP) was elevated above 135/85 this visit, please have this repeated by your doctor within one month.

## 2018-06-22 NOTE — ED Provider Notes (Signed)
[facility] EMERGENCY DEPARTMENT Provider Note   CSN: [id number] Arrival date & time: 06/21/18  2336    History   Chief Complaint Chief Complaint  Patient presents with  . Fever    HPI Nicole Lopez is a 23 y.o. female.     Patient with history of PCOS, diabetes --presents the emergency department with 24 to 48-hour history of fever, cough, shortness of breath, generalized body aches, nasal congestion.  She denies sore throat, ear pain, nausea, vomiting, or diarrhea.  No urinary symptoms including dysuria, increased frequency urgency, or hematuria.  No swelling of her arms or legs or rashes noted.  Has been taking over-the-counter medication without much relief.  No history of asthma.  No known sick contacts including contact with coronavirus.  She is on metformin for her diabetes.  Onset of symptoms acute.  Course is constant.  Nothing makes symptoms better.     Past Medical History:  Diagnosis Date  . PCOS (polycystic ovarian syndrome)     Patient Active Problem List   Diagnosis Date Noted  . Diabetes ([place]) 04/03/2017    Past Surgical History:  Procedure Laterality Date  . SALPINGOOPHORECTOMY Left 2012  . TONSILLECTOMY       OB History   No obstetric history on file.      Home Medications    Prior to Admission medications   Medication Sig Start Date End Date Taking? Authorizing Provider  ACCU-CHEK FASTCLIX LANCETS MISC Use to test blood sugar daily 04/30/17   Renato Shin, MD  Blood Glucose Monitoring Suppl (ACCU-CHEK GUIDE) w/Device KIT 1 Device by Does not apply route as directed. Use daily to test blood sugar 04/30/17   Renato Shin, MD  cephALEXin (KEFLEX) 500 MG capsule Take 1 capsule (500 mg total) by mouth 4 (four) times daily. 01/30/17   Lajean Saver, MD  glucose blood (ACCU-CHEK GUIDE) test strip Use to test blood sugar daily 04/30/17   Renato Shin, MD  ibuprofen (ADVIL,MOTRIN) 200 MG tablet Take 400 mg by mouth every 6 (six) hours  as needed for cramping.    [provider]  metFORMIN (GLUCOPHAGE-XR) 500 MG 24 hr tablet TAKE 2 TABLETS BY MOUTH EVERY DAY WITH BREAKFAST 05/04/18   Renato Shin, MD    Family History Family History  Problem Relation Age of Onset  . Gestational diabetes Mother   . Hypertension Father   . Heart murmur Sister     Social History Social History   Tobacco Use  . Smoking status: Never Smoker  . Smokeless tobacco: Never Used  Substance Use Topics  . Alcohol use: No    Frequency: Never  . Drug use: No     Allergies   Penicillins   Review of Systems Review of Systems  Constitutional: Positive for chills and fever. Negative for fatigue.  HENT: Positive for congestion. Negative for ear pain, rhinorrhea, sinus pressure and sore throat.   Eyes: Negative for redness.  Respiratory: Positive for cough and shortness of breath. Negative for wheezing.   Gastrointestinal: Negative for abdominal pain, diarrhea, nausea and vomiting.  Genitourinary: Negative for dysuria.  Musculoskeletal: Positive for myalgias. Negative for neck stiffness.  Skin: Negative for rash.  Neurological: Negative for headaches.  Hematological: Negative for adenopathy.     Physical Exam Updated Vital Signs BP (!) 147/100 (BP Location: Right Arm)   Pulse (!) 115   Temp 98.8 F (37.1 C) (Oral)   Resp 18   Ht '5\' 10"'$  (1.778 m)  Wt (!) 158.8 kg   LMP 06/07/2018 (Approximate)   SpO2 99%   BMI 50.22 kg/m   Physical Exam Vitals signs and nursing note reviewed.  Constitutional:      Appearance: She is well-developed.  HENT:     Head: Normocephalic and atraumatic.     Right Ear: Tympanic membrane, ear canal and external ear normal.     Left Ear: Tympanic membrane, ear canal and external ear normal.     Nose: Congestion and rhinorrhea present.     Mouth/Throat:     Pharynx: Oropharynx is clear. No pharyngeal swelling.     Tonsils: No tonsillar exudate or tonsillar abscesses.  Eyes:     General:         Right eye: No discharge.        Left eye: No discharge.     Conjunctiva/sclera: Conjunctivae normal.  Neck:     Musculoskeletal: Normal range of motion and neck supple.  Cardiovascular:     Rate and Rhythm: Regular rhythm. Tachycardia present.     Heart sounds: Normal heart sounds.  Pulmonary:     Effort: Pulmonary effort is normal.     Breath sounds: Normal breath sounds.     Comments: Coughing frequently during exam. Abdominal:     Palpations: Abdomen is soft.     Tenderness: There is no abdominal tenderness. There is no guarding or rebound.  Skin:    General: Skin is warm and dry.  Neurological:     Mental Status: She is alert.      ED Treatments / Results  Labs (all labs ordered are listed, but only abnormal results are displayed) Labs Reviewed  CBC WITH DIFFERENTIAL/PLATELET - Abnormal; Notable for the following components:      Result Value   RBC 5.28 (*)    Hemoglobin 11.6 (*)    MCV 72.7 (*)    MCH 22.0 (*)    RDW 16.5 (*)    Lymphs Abs 0.5 (*)    Abs Immature Granulocytes 0.10 (*)    All other components within normal limits  URINALYSIS, ROUTINE W REFLEX MICROSCOPIC - Abnormal; Notable for the following components:   Specific Gravity, Urine 1.035 (*)    Glucose, UA >=500 (*)    Hgb urine dipstick SMALL (*)    Ketones, ur 5 (*)    Leukocytes,Ua MODERATE (*)    Bacteria, UA FEW (*)    All other components within normal limits  NOVEL CORONAVIRUS, NAA (HOSPITAL ORDER, SEND-OUT TO REF LAB)  LACTIC ACID, PLASMA  LACTIC ACID, PLASMA  COMPREHENSIVE METABOLIC PANEL  I-STAT BETA HCG BLOOD, ED ([facility], [facility], [facility] ONLY)    EKG EKG Interpretation  Date/Time:  Wednesday Jun 22 2018 00:09:16 EDT Ventricular Rate:  142 PR Interval:  146 QRS Duration: 82 QT Interval:  264 QTC Calculation: 406 R Axis:   30 Text Interpretation:  Sinus tachycardia with Premature ventricular complexes or Fusion complexes Possible Anterior infarct , age undetermined Abnormal ECG No  previous ECGs available Confirmed by Ripley Fraise [phone]) on 06/22/2018 12:17:15 AM   Radiology Dg Chest Portable 1 View  Result Date: 06/22/2018 CLINICAL DATA:  23 year old female with fever fatigue, body aches. EXAM: PORTABLE CHEST 1 VIEW COMPARISON:  CT Abdomen and Pelvis 01/30/2017. FINDINGS: Portable AP semi upright view at 0013 hours. Normal cardiac size and mediastinal contours. Lung volumes are within normal limits. Allowing for portable technique the lungs are clear. Visualized tracheal air column is within normal limits. Paucity of bowel gas  in the upper abdomen. No osseous abnormality identified. IMPRESSION: Negative portable chest. Electronically Signed   By: Genevie Ann M.D.   On: 06/22/2018 00:36    Procedures Procedures (including critical care time)  Medications Ordered in ED Medications  sodium chloride flush (NS) 0.9 % injection 3 mL (3 mLs Intravenous Not Given 06/22/18 0108)  sodium chloride 0.9 % bolus 1,000 mL (has no administration in time range)     Initial Impression / Assessment and Plan / ED Course  I have reviewed the triage vital signs and the nursing notes.  Pertinent labs & imaging results that were available during my care of the patient were reviewed by me and considered in my medical decision making (see chart for details).        Patient seen and examined. Work-up initiated. Medications ordered.   Vital signs reviewed and are as follows: BP (!) 147/100 (BP Location: Right Arm)   Pulse (!) 115   Temp 98.8 F (37.1 C) (Oral)   Resp 18   Ht '5\' 10"'$  (1.778 m)   Wt (!) 158.8 kg   LMP 06/07/2018 (Approximate)   SpO2 99%   BMI 50.22 kg/m   We will send outpatient COVID testing.  Will treat tachycardia with fluids.  Patient is in no current distress.  Chest x-ray reviewed and is normal appearing.  No hypoxia.  No clinical signs symptoms of DVT or chest pain to suggest PE at this time.  5:01 AM patient has received 2 L normal saline.  Discussed  results with patient.  Overall she is feeling better.  She continues to be mildly orthostatic.  Her heart rate does jump up into the 130s.  Patient states that she does not feel nearly as lightheaded with standing at this time.  Will give additional fluids and reassess.  Pt discussed with Dr. Christy Gentles.   6:20 AM observed patient ambulate to the restroom.  She states that she is feeling much better and is not lightheaded now with standing.  Anticipate discharged home.  Will recheck vital signs.  Final Clinical Impressions(s) / ED Diagnoses   Final diagnoses:  Febrile illness  Cough  Elevated liver enzymes   Patient with reported fevers, cough, body aches.  Work-up in the emergency department shows normal white blood cell count with lymphopenia.  She also has mildly elevated liver enzymes.  She does not have a sore throat and I have low suspicion for mononucleosis.  Her chest x-ray is clear without signs of pneumonia.  Patient has been tachycardic while in the emergency department.  This was treated with IV fluids.  She was orthostatic.  Early in her course however appears much improved and states that she feels much improved after treatment.  She denies any chest pain or clinical signs of DVT to suggest PE.  Feel that she likely has a viral illness.  COVID-19 is a possibility and patient was tested this morning.  This test will be pending.  However given her medical history and current appearance, feel that she may be discharged home to self quarantine and isolate.  We discussed that she needs to isolate until her symptoms have lasted at least 10 days with 3 additional days of improvement.  We discussed at length signs and symptoms which should cause her to return including worsening shortness of breath or trouble breathing.  She seems reliable to return if symptoms worsen.  ED Discharge Orders         Ordered    benzonatate (  TESSALON) 100 MG capsule  Every 8 hours     06/22/18 0624            Carlisle Cater, PA-C 06/22/18 0630    Ripley Fraise, MD 06/22/18 [phone]

## 2018-06-23 LAB — NOVEL CORONAVIRUS, NAA (HOSP ORDER, SEND-OUT TO REF LAB; TAT 18-24 HRS): SARS-CoV-2, NAA: NOT DETECTED

## 2019-05-19 ENCOUNTER — Other Ambulatory Visit: Payer: Self-pay | Admitting: Endocrinology

## 2019-06-22 ENCOUNTER — Other Ambulatory Visit: Payer: Self-pay | Admitting: Endocrinology

## 2020-09-22 IMAGING — DX PORTABLE CHEST - 1 VIEW
1 series · 1 of 1 positions shown · non-contrast
Comparison: CT Abdomen and Pelvis 01/30/2017.

CLINICAL DATA: 23-year-old female with fever fatigue, body aches.

EXAM:
PORTABLE CHEST 1 VIEW

[chest]
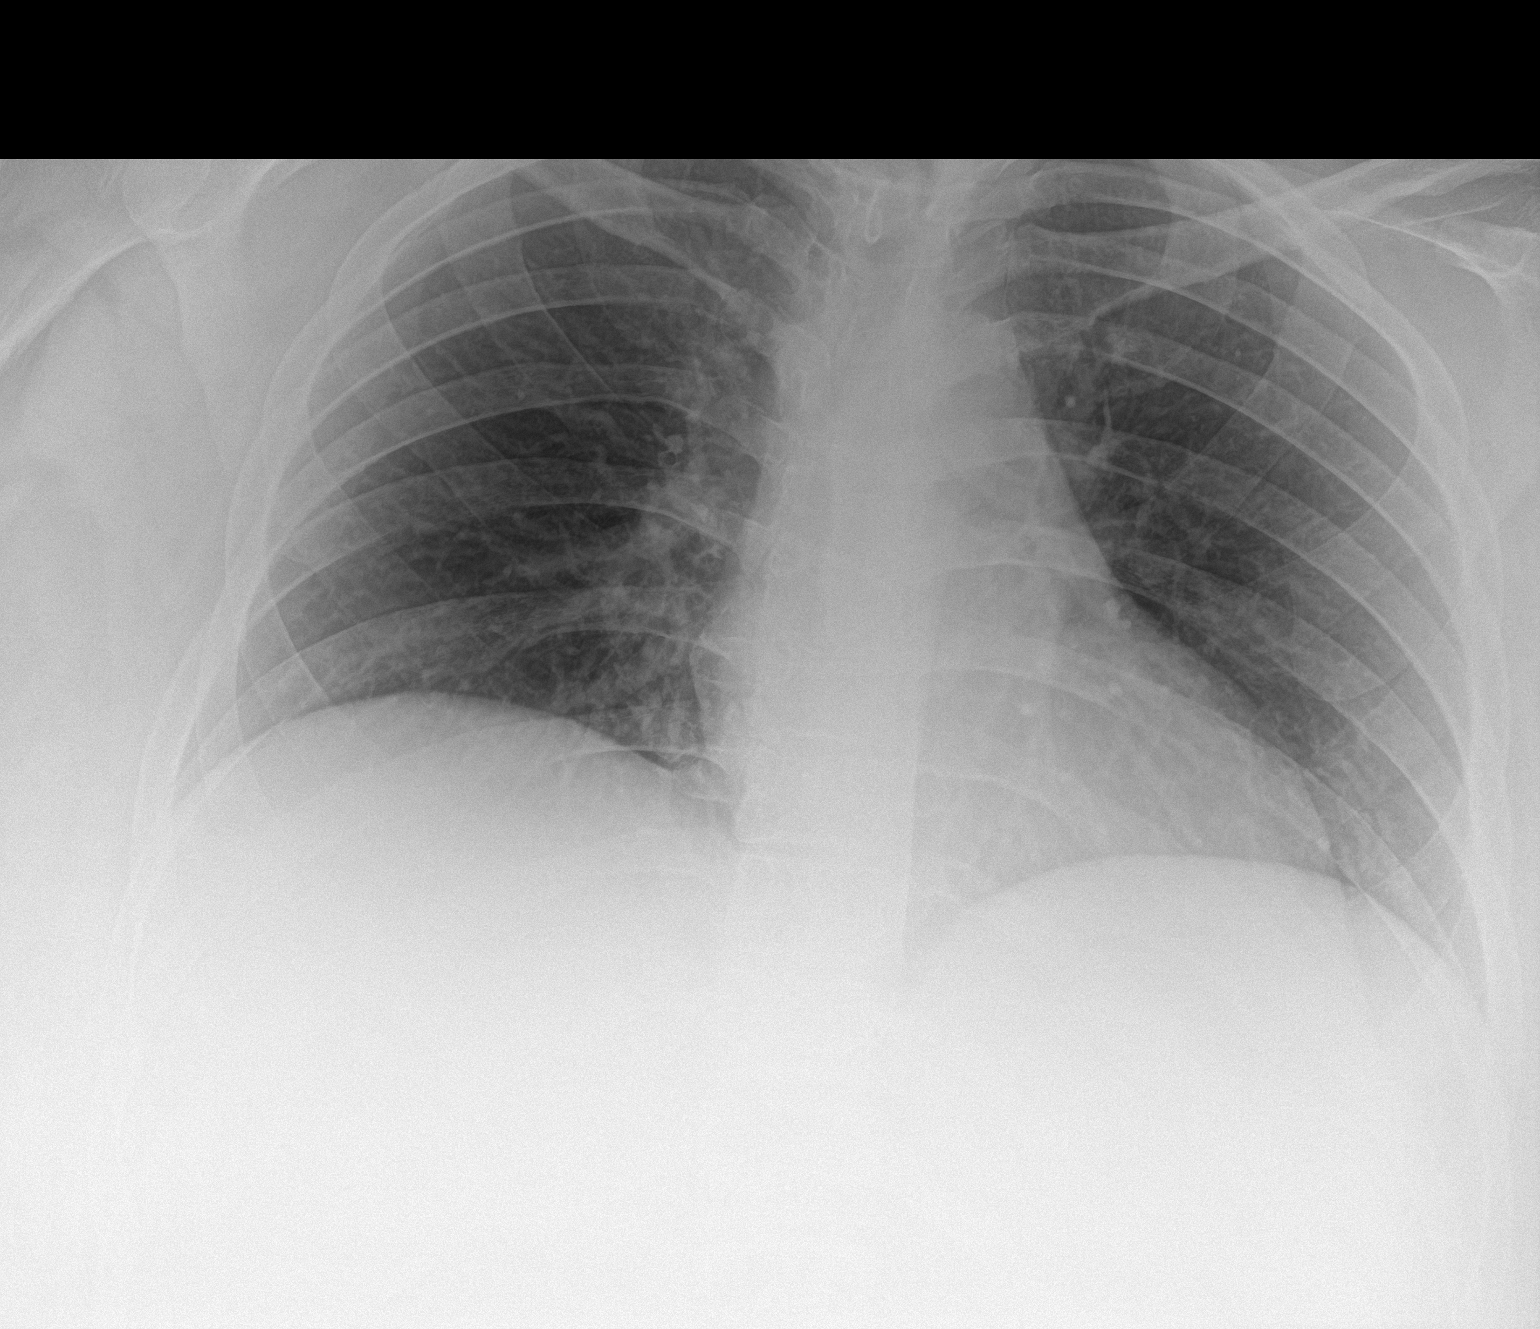

[1 of 1 positions shown; findings below may reference images not displayed]

FINDINGS: Portable AP semi upright view at 2297 hours. Normal cardiac size and
mediastinal contours. Lung volumes are within normal limits.
Allowing for portable technique the lungs are clear. Visualized
tracheal air column is within normal limits. Paucity of bowel gas in
the upper abdomen. No osseous abnormality identified.
IMPRESSION: Negative portable chest.
